# Patient Record
Sex: Male | Born: 1990 | Race: Black or African American | Hispanic: No | Marital: Single | Smoking: Never smoker
Health system: Southern US, Community
[De-identification: ages and names within clinical notes are randomized; demographics above are authoritative.]

## PROBLEM LIST (undated history)

## (undated) DIAGNOSIS — E119 Type 2 diabetes mellitus without complications: Secondary | ICD-10-CM

## (undated) HISTORY — PX: TONSILLECTOMY: SUR1361

---

## 2014-05-22 ENCOUNTER — Encounter (HOSPITAL_BASED_OUTPATIENT_CLINIC_OR_DEPARTMENT_OTHER): Payer: Self-pay | Admitting: *Deleted

## 2014-05-22 ENCOUNTER — Emergency Department (HOSPITAL_BASED_OUTPATIENT_CLINIC_OR_DEPARTMENT_OTHER)
Admission: EM | Admit: 2014-05-22 | Discharge: 2014-05-23 | Disposition: A | Discharge status: Home / Self Care | Payer: Managed Care, Other (non HMO) | Attending: Emergency Medicine | Admitting: Emergency Medicine

## 2014-05-22 DIAGNOSIS — L02215 Cutaneous abscess of perineum: Secondary | ICD-10-CM | POA: Insufficient documentation

## 2014-05-22 DIAGNOSIS — E119 Type 2 diabetes mellitus without complications: Secondary | ICD-10-CM | POA: Diagnosis not present

## 2014-05-22 DIAGNOSIS — Z794 Long term (current) use of insulin: Secondary | ICD-10-CM | POA: Insufficient documentation

## 2014-05-22 HISTORY — DX: Type 2 diabetes mellitus without complications: E11.9

## 2014-05-22 LAB — CBG MONITORING, ED: Glucose-Capillary: 284 mg/dL — ABNORMAL HIGH (ref 70–99)

## 2014-05-22 NOTE — ED Notes (Signed)
Painful lump to perineum since friday

## 2014-05-23 ENCOUNTER — Emergency Department (HOSPITAL_BASED_OUTPATIENT_CLINIC_OR_DEPARTMENT_OTHER): Payer: Managed Care, Other (non HMO)

## 2014-05-23 LAB — CBC WITH DIFFERENTIAL/PLATELET
BASOS PCT: 0 % (ref 0–1)
Basophils Absolute: 0 10*3/uL (ref 0.0–0.1)
Eosinophils Absolute: 0 10*3/uL (ref 0.0–0.7)
Eosinophils Relative: 0 % (ref 0–5)
HCT: 40.2 % (ref 39.0–52.0)
Hemoglobin: 14 g/dL (ref 13.0–17.0)
LYMPHS ABS: 0.9 10*3/uL (ref 0.7–4.0)
Lymphocytes Relative: 11 % — ABNORMAL LOW (ref 12–46)
MCH: 33.6 pg (ref 26.0–34.0)
MCHC: 34.8 g/dL (ref 30.0–36.0)
MCV: 96.4 fL (ref 78.0–100.0)
MONOS PCT: 11 % (ref 3–12)
Monocytes Absolute: 0.9 10*3/uL (ref 0.1–1.0)
NEUTROS ABS: 6.5 10*3/uL (ref 1.7–7.7)
Neutrophils Relative %: 78 % — ABNORMAL HIGH (ref 43–77)
PLATELETS: 199 10*3/uL (ref 150–400)
RBC: 4.17 MIL/uL — ABNORMAL LOW (ref 4.22–5.81)
RDW: 10.3 % — ABNORMAL LOW (ref 11.5–15.5)
WBC: 8.3 10*3/uL (ref 4.0–10.5)

## 2014-05-23 LAB — BASIC METABOLIC PANEL
Anion gap: 4 — ABNORMAL LOW (ref 5–15)
BUN: 10 mg/dL (ref 6–23)
CALCIUM: 8.6 mg/dL (ref 8.4–10.5)
CO2: 24 mmol/L (ref 19–32)
Chloride: 104 mmol/L (ref 96–112)
Creatinine, Ser: 0.76 mg/dL (ref 0.50–1.35)
GFR calc Af Amer: >90 mL/min (ref >90)
GLUCOSE: 316 mg/dL — AB (ref 70–99)
Potassium: 3.6 mmol/L (ref 3.5–5.1)
Sodium: 132 mmol/L — ABNORMAL LOW (ref 135–145)

## 2014-05-23 LAB — URINALYSIS, ROUTINE W REFLEX MICROSCOPIC
Bilirubin Urine: NEGATIVE
HGB URINE DIPSTICK: NEGATIVE
Ketones, ur: 15 mg/dL — AB
Leukocytes, UA: NEGATIVE
Nitrite: NEGATIVE
Protein, ur: NEGATIVE mg/dL
Urobilinogen, UA: 1 mg/dL (ref 0.0–1.0)
pH: 7.5 (ref 5.0–8.0)

## 2014-05-23 LAB — URINE MICROSCOPIC-ADD ON

## 2014-05-23 MED ORDER — OXYCODONE-ACETAMINOPHEN 5-325 MG PO TABS: 1.0000 | ORAL_TABLET | Freq: Four times a day (QID) | ORAL | Status: AC | PRN

## 2014-05-23 MED ORDER — DOXYCYCLINE HYCLATE 100 MG PO CAPS
100.0000 mg | ORAL_CAPSULE | Freq: Two times a day (BID) | ORAL | Status: AC
Start: 2014-05-23 — End: ?

## 2014-05-23 MED ORDER — IOHEXOL 300 MG/ML  SOLN
100.0000 mL | Freq: Once | INTRAMUSCULAR | Status: AC | PRN
  Administered 2014-05-23: 100 mL via INTRAVENOUS

## 2014-05-23 MED ORDER — CEPHALEXIN 500 MG PO CAPS: 500.0000 mg | ORAL_CAPSULE | Freq: Four times a day (QID) | ORAL | Status: AC

## 2014-05-23 MED ORDER — LIDOCAINE HCL 2 % IJ SOLN
INTRAMUSCULAR | Status: AC
Start: 2014-05-23 — End: 2014-05-23
  Administered 2014-05-23: 400 mg
  Filled 2014-05-23: qty 20

## 2014-05-23 MED ORDER — HYDROMORPHONE HCL 1 MG/ML IJ SOLN
1.0000 mg | Freq: Once | INTRAMUSCULAR | Status: AC
  Administered 2014-05-23: 1 mg via INTRAVENOUS
  Filled 2014-05-23: qty 1

## 2014-05-23 MED ORDER — LIDOCAINE HCL (PF) 1 % IJ SOLN: 30.0000 mL | Freq: Once | INTRAMUSCULAR | Status: DC

## 2014-05-23 NOTE — ED Notes (Signed)
PA at bedside preparing for abscess I&D.

## 2014-05-23 NOTE — Discharge Instructions (Signed)
Cellulitis °Cellulitis is an infection of the skin and the tissue beneath it. The infected area is usually red and tender. Cellulitis occurs most often in the arms and lower legs.  °CAUSES  °Cellulitis is caused by bacteria that enter the skin through cracks or cuts in the skin. The most common types of bacteria that cause cellulitis are staphylococci and streptococci. °SIGNS AND SYMPTOMS  °· Redness and warmth. °· Swelling. °· Tenderness or pain. °· Fever. °DIAGNOSIS  °Your health care provider can usually determine what is wrong based on a physical exam. Blood tests may also be done. °TREATMENT  °Treatment usually involves taking an antibiotic medicine. °HOME CARE INSTRUCTIONS  °· Take your antibiotic medicine as directed by your health care provider. Finish the antibiotic even if you start to feel better. °· Keep the infected arm or leg elevated to reduce swelling. °· Apply a warm cloth to the affected area up to 4 times per day to relieve pain. °· Take medicines only as directed by your health care provider. °· Keep all follow-up visits as directed by your health care provider. °SEEK MEDICAL CARE IF:  °· You notice red streaks coming from the infected area. °· Your red area gets larger or turns dark in color. °· Your bone or joint underneath the infected area becomes painful after the skin has healed. °· Your infection returns in the same area or another area. °· You notice a swollen bump in the infected area. °· You develop new symptoms. °· You have a fever. °SEEK IMMEDIATE MEDICAL CARE IF:  °· You feel very sleepy. °· You develop vomiting or diarrhea. °· You have a general ill feeling (malaise) with muscle aches and pains. °MAKE SURE YOU:  °· Understand these instructions. °· Will watch your condition. °· Will get help right away if you are not doing well or get worse. °Document Released: 12/11/2004 Document Revised: 07/18/2013 Document Reviewed: 05/19/2011 °[employer]® Patient Information ©2015 [employer].  This information is not intended to replace advice given to you by your health care provider. Make sure you discuss any questions you have with your health care provider. ° °Abscess °An abscess is an infected area that contains a collection of pus and debris. It can occur in almost any part of the body. An abscess is also known as a furuncle or boil. °CAUSES  °An abscess occurs when tissue gets infected. This can occur from blockage of oil or sweat glands, infection of hair follicles, or a minor injury to the skin. As the body tries to fight the infection, pus collects in the area and creates pressure under the skin. This pressure causes pain. People with weakened immune systems have difficulty fighting infections and get certain abscesses more often.  °SYMPTOMS °Usually an abscess develops on the skin and becomes a painful mass that is red, warm, and tender. If the abscess forms under the skin, you may feel a moveable soft area under the skin. Some abscesses break open (rupture) on their own, but most will continue to get worse without care. The infection can spread deeper into the body and eventually into the bloodstream, causing you to feel ill.  °DIAGNOSIS  °Your caregiver will take your medical history and perform a physical exam. A sample of fluid may also be taken from the abscess to determine what is causing your infection. °TREATMENT  °Your caregiver may prescribe antibiotic medicines to fight the infection. However, taking antibiotics alone usually does not cure an abscess. Your caregiver may need to   make a small cut (incision) in the abscess to drain the pus. In some cases, gauze is packed into the abscess to reduce pain and to continue draining the area. °HOME CARE INSTRUCTIONS  °· Only take over-the-counter or prescription medicines for pain, discomfort, or fever as directed by your caregiver. °· If you were prescribed antibiotics, take them as directed. Finish them even if you start to feel  better. °· If gauze is used, follow your caregiver's directions for changing the gauze. °· To avoid spreading the infection: °¨ Keep your draining abscess covered with a bandage. °¨ Wash your hands well. °¨ Do not share personal care items, towels, or whirlpools with others. °¨ Avoid skin contact with others. °· Keep your skin and clothes clean around the abscess. °· Keep all follow-up appointments as directed by your caregiver. °SEEK MEDICAL CARE IF:  °· You have increased pain, swelling, redness, fluid drainage, or bleeding. °· You have muscle aches, chills, or a general ill feeling. °· You have a fever. °MAKE SURE YOU:  °· Understand these instructions. °· Will watch your condition. °· Will get help right away if you are not doing well or get worse. °Document Released: 12/11/2004 Document Revised: 09/02/2011 Document Reviewed: 05/16/2011 °[employer]® Patient Information ©2015 [employer]. This information is not intended to replace advice given to you by your health care provider. Make sure you discuss any questions you have with your health care provider. ° °

## 2014-05-23 NOTE — ED Provider Notes (Signed)
CSN: [id number]     Arrival date & time 05/22/14  2005 History   First MD Initiated Contact with Patient 05/23/14 0001     Chief Complaint  Patient presents with  . Abscess     (Consider location/radiation/quality/duration/timing/severity/associated sxs/prior Treatment) HPI Pt is a 23 yom with pmhx of DM who presents to the ER c/o abscess to perineum.  Pt states his s/s began gradually 4 days ago, and have since worsened.  Pt reports pain and swelling to his perineum 10/10 pain.  Pt denies associated N/V/D, abd pain, fever, numbness, dysuria, rectal pain, constipation.    Past Medical History  Diagnosis Date  . Diabetes mellitus without complication    Past Surgical History  Procedure Laterality Date  . Tonsillectomy     No family history on file. History  Substance Use Topics  . Smoking status: Never Smoker   . Smokeless tobacco: Never Used  . Alcohol Use: No    Review of Systems  Constitutional: Negative for fever and chills.  Skin: Positive for rash.      Allergies  Review of patient's allergies indicates no known allergies.  Home Medications   Prior to Admission medications   Medication Sig Start Date End Date Taking? Authorizing Provider  insulin aspart (NOVOLOG) 100 UNIT/ML injection Inject into the skin 3 (three) times daily before meals. SS   Yes Historical Provider, MD  insulin glargine (LANTUS) 100 UNIT/ML injection Inject 24 Units into the skin at bedtime.   Yes Historical Provider, MD  cephALEXin (KEFLEX) 500 MG capsule Take 1 capsule (500 mg total) by mouth 4 (four) times daily. 05/23/14   Ladona Mow, PA-C  doxycycline (VIBRAMYCIN) 100 MG capsule Take 1 capsule (100 mg total) by mouth 2 (two) times daily. One po bid x 7 days 05/23/14   Ladona Mow, PA-C  oxyCODONE-acetaminophen (PERCOCET) 5-325 MG per tablet Take 1-2 tablets by mouth every 6 (six) hours as needed. 05/23/14   Ladona Mow, PA-C   BP 133/83 mmHg  Pulse 105  Temp(Src) 100 F (37.8 C) (Oral)  Resp 16   Ht  (1.702 m)  Wt 145 lb (65.772 kg)  BMI 22.71 kg/m2  SpO2 98% Physical Exam  Constitutional: He is oriented to person, place, and time. He appears well-developed and well-nourished. No distress.  HENT:  Head: Normocephalic and atraumatic.  Eyes: Right eye exhibits no discharge. Left eye exhibits no discharge. No scleral icterus.  Neck: Normal range of motion.  Pulmonary/Chest: Effort normal. No respiratory distress.  Abdominal: Hernia confirmed negative in the right inguinal area and confirmed negative in the left inguinal area.  Genitourinary: Penis normal. Cremasteric reflex is present. Right testis shows no mass, no swelling and no tenderness. Right testis is descended. Cremasteric reflex is not absent on the right side. Left testis shows no mass, no swelling and no tenderness. Left testis is descended. Cremasteric reflex is not absent on the left side. Circumcised. No penile tenderness. No discharge found.     4-5x3 cm raised area of induration noted extending from posterior scrotum to rectum.  No scrotal swelling, testicular pain.  No rectal involvement.    Musculoskeletal: Normal range of motion.  Lymphadenopathy:       Right: No inguinal adenopathy present.       Left: No inguinal adenopathy present.  Neurological: He is alert and oriented to person, place, and time.  Skin: Skin is warm and dry. He is not diaphoretic.  Psychiatric: He has a normal mood and affect.  Nursing note and vitals reviewed.   ED Course  INCISION AND DRAINAGE Date/Time: 05/23/2014 2:00 PM Performed by: Ladona MowMINTZ, Misako Roeder Authorized by: Ladona MowMINTZ, Taylan Mayhan Consent: Verbal consent obtained. Consent given by: patient Type: abscess Body area: anogenital Location details: perineum Anesthesia: local infiltration Local anesthetic: lidocaine 2% without epinephrine Anesthetic total: 4 ml Patient sedated: no Scalpel size: 11 Incision type: single straight Complexity: simple Drainage: bloody Drainage amount:  scant Wound treatment: wound left open Patient tolerance: Patient tolerated the procedure well with no immediate complications   (including critical care time) Labs Review Labs Reviewed  CBC WITH DIFFERENTIAL/PLATELET - Abnormal; Notable for the following:    RBC 4.17 (*)    RDW 10.3 (*)    Neutrophils Relative % 78 (*)    Lymphocytes Relative 11 (*)    All other components within normal limits  BASIC METABOLIC PANEL - Abnormal; Notable for the following:    Sodium 132 (*)    Glucose, Bld 316 (*)    Anion gap 4 (*)    All other components within normal limits  URINALYSIS, ROUTINE W REFLEX MICROSCOPIC - Abnormal; Notable for the following:    Specific Gravity, Urine >1.046 (*)    Glucose, UA >1000 (*)    Ketones, ur 15 (*)    All other components within normal limits  CBG MONITORING, ED - Abnormal; Notable for the following:    Glucose-Capillary 284 (*)    All other components within normal limits  URINE MICROSCOPIC-ADD ON    Imaging Review Ct Abdomen Pelvis W Contrast  05/23/2014   CLINICAL DATA:  Painful perineum lump for 4 days. History of diabetes.  EXAM: CT ABDOMEN AND PELVIS WITH CONTRAST  TECHNIQUE: Multidetector CT imaging of the abdomen and pelvis was performed using the standard protocol following bolus administration of intravenous contrast.  CONTRAST:  100mL OMNIPAQUE IOHEXOL 300 MG/ML  SOLN  COMPARISON:  None.  FINDINGS: LUNG BASES: Included view of the lung bases are clear. Visualized heart and pericardium are unremarkable.  SOLID ORGANS: The liver, spleen, gallbladder, pancreas and adrenal glands are unremarkable.  GASTROINTESTINAL TRACT: The stomach, small and large bowel are normal in course and caliber without inflammatory changes. Moderate amount of retained large bowel stool. Normal air-filled appendix.  KIDNEYS/ URINARY TRACT: Kidneys are orthotopic, demonstrating symmetric enhancement. No nephrolithiasis, hydronephrosis or solid renal masses. The unopacified ureters  are normal in course and caliber. Urinary bladder is partially distended and unremarkable.  PERITONEUM/RETROPERITONEUM: Aortoiliac vessels are normal in course and caliber. No lymphadenopathy by CT size criteria. Prostate appears enlarged and edematous. Seminal vesicle calcifications can be seen with diabetes. No intraperitoneal free fluid nor free air.  SOFT TISSUE/OSSEOUS STRUCTURES: Partially imaged LEFT hydrocele. Inflammation and superimposed superficial 17 mm ovoid fluid collection within the junction of the scrotum and perineum towards the LEFT, axial 90/93. No peroneal subcutaneous gas or radiopaque foreign bodies.  IMPRESSION: Perineal inflammation suggests cellulitis with superimposed 17 mm superficial abscess at junction of scrotum and perineum toward the LEFT. No subcutaneous gas to suggest Fournier's gangrene.  Enlarged edematous prostate concerning for prostatitis.   Electronically Signed   By: Awilda Metroourtnay  Bloomer   On: 05/23/2014 01:40     EKG Interpretation None      MDM   Final diagnoses:  Perineal abscess    4 days of perineal abscess. Abscess extends throughout perineum-- will f/u with CT for r/o of fournier's vs tracking abscess.    CT with impression: Perineal inflammation suggests cellulitis with superimposed 17 mm superficial  abscess at junction of scrotum and perineum toward the LEFT. No subcutaneous gas to suggest Fournier's gangrene.  Enlarged edematous prostate concerning for prostatitis.  Based on these results, abscess appears superficial and amenable to I&D in the ER.  Abscess was not large enough to warrant packing or drain,  wound recheck in 2 days. Encouraged home warm soaks and flushing.  Mild signs of cellulitis is surrounding skin.  Will d/c to home.  Abx given based on hx of DM and location/induration of abscess.  No concern for prostatitis on history and exam, although I conveyed to pt the fact that this was noted on the CT and strongly encouraged him to f/u  with his PCP regarding these findings.  I discussed return precautions with pt and encouraged pt to call or return to the ER should he have any questions or concerns.    BP 133/83 mmHg  Pulse 105  Temp(Src) 100 F (37.8 C) (Oral)  Resp 16  Ht  (1.702 m)  Wt 145 lb (65.772 kg)  BMI 22.71 kg/m2  SpO2 98%  Signed,  Ladona Mow, PA-C 2:01 PM  Patient seen and discussed with Dr. Brock Bad, MD  Ladona Mow, PA-C 05/23/14 1401  Paula Libra, MD 05/23/14 2257

## 2014-05-23 NOTE — ED Notes (Signed)
PA at bedside.

## 2016-04-05 IMAGING — CT CT ABD-PELV W/ CM
2 of 4 series · 16 of 46 positions shown, 18 images · IV contrast (APPLIED)
Comparison: None.

CLINICAL DATA: Painful perineum lump for 4 days. History of
diabetes.

EXAM:
CT ABDOMEN AND PELVIS WITH CONTRAST
TECHNIQUE: Multidetector CT imaging of the abdomen and pelvis was performed
using the standard protocol following bolus administration of
intravenous contrast.
CONTRAST:  100mL OMNIPAQUE IOHEXOL 300 MG/ML  SOLN

[Series 2: abd/pelvis 5.0 b31f · axial · 0.70mm/px · z∈[-309,+116]mm · 13 of 93 slices shown, 15 images]
[im 4/93  soft-tissue]
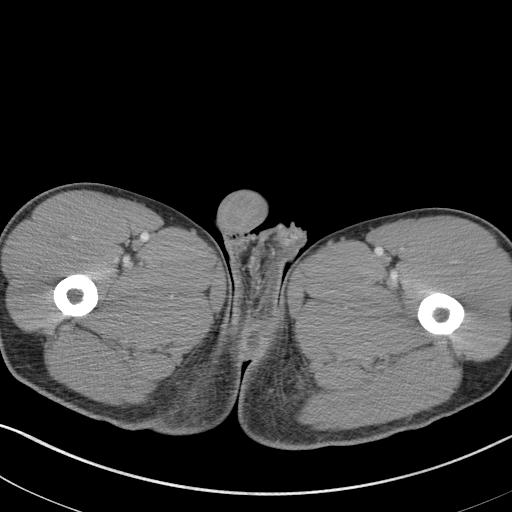
[im 4/93  bone]
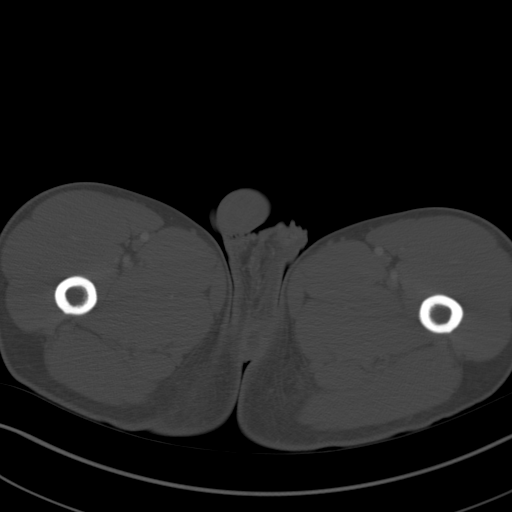
[im 12/93  soft-tissue]
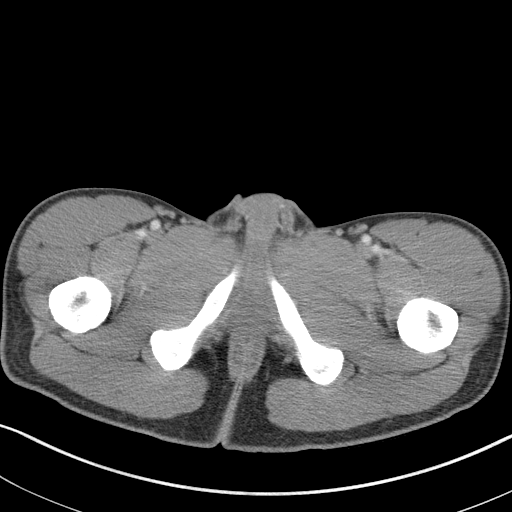
[im 20/93  soft-tissue]
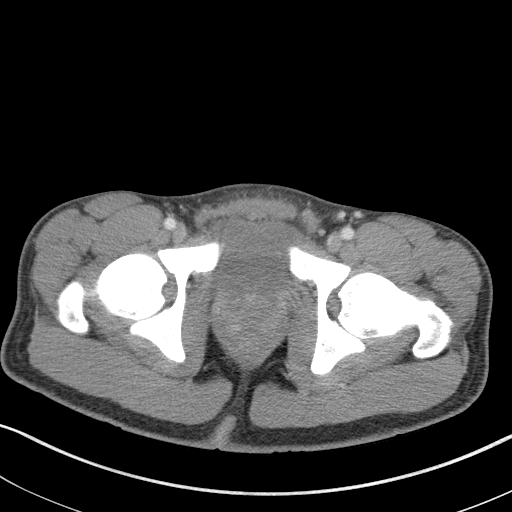
[im 27/93  soft-tissue]
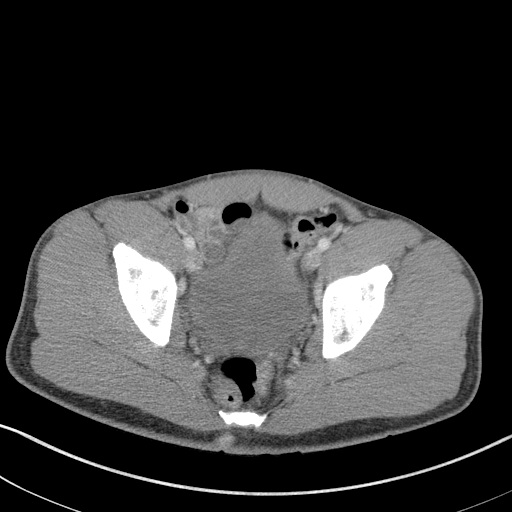
[im 31/93  soft-tissue]
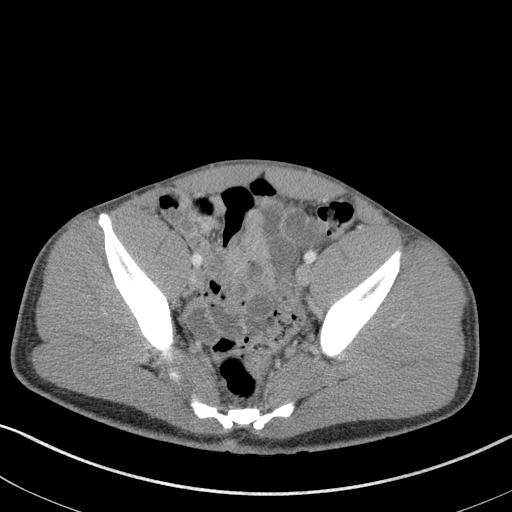
[im 39/93  soft-tissue]
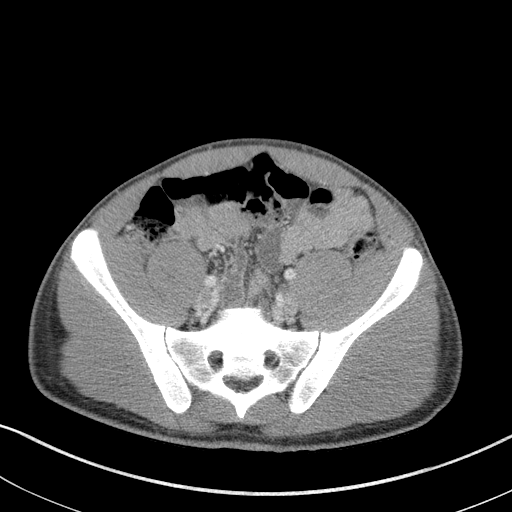
[im 47/93  soft-tissue]
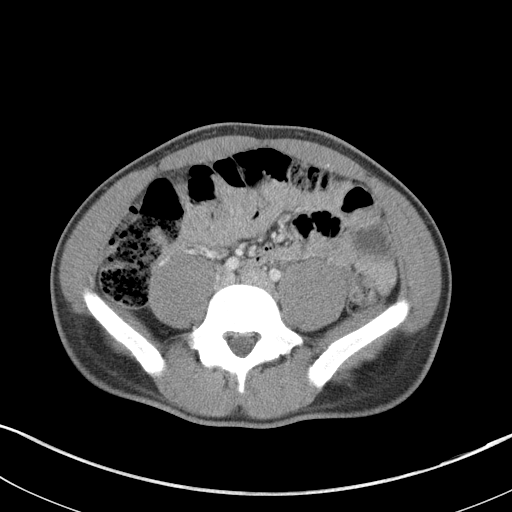
[im 54/93  soft-tissue]
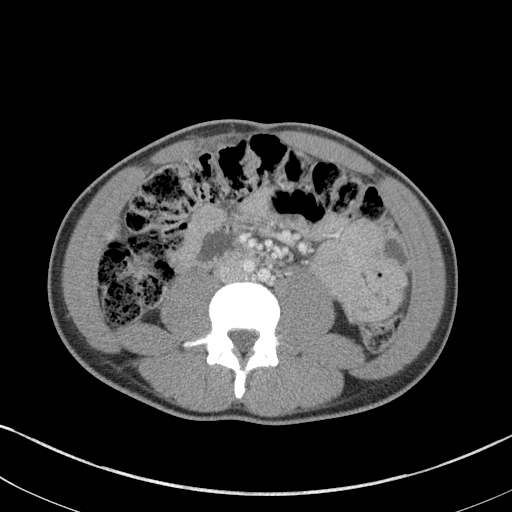
[im 62/93  soft-tissue]
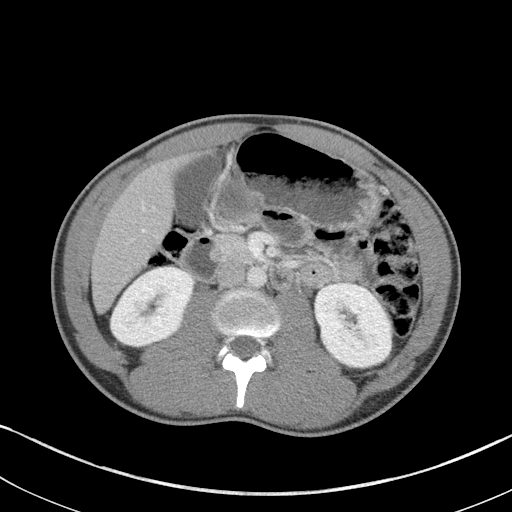
[im 62/93  bone]
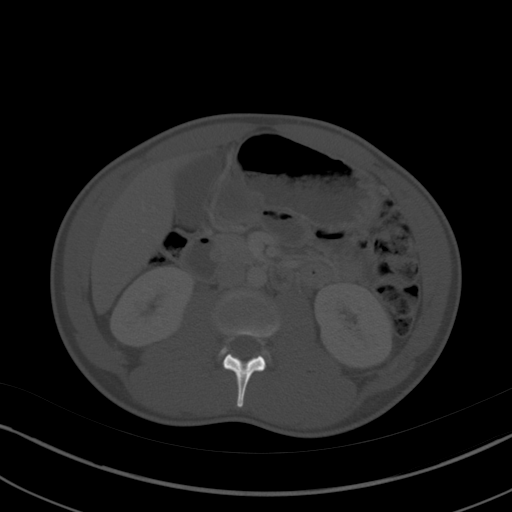
[im 66/93  soft-tissue]
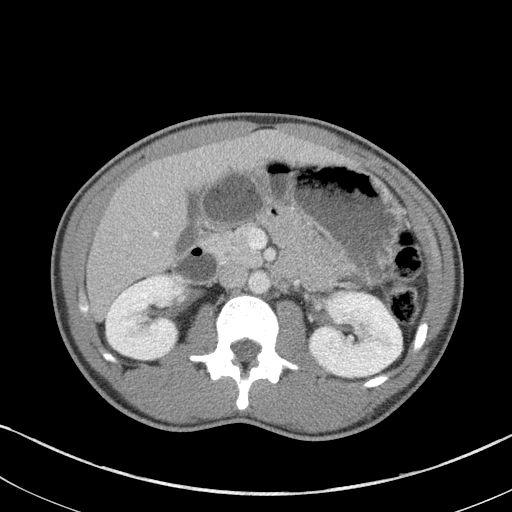
[im 73/93  soft-tissue]
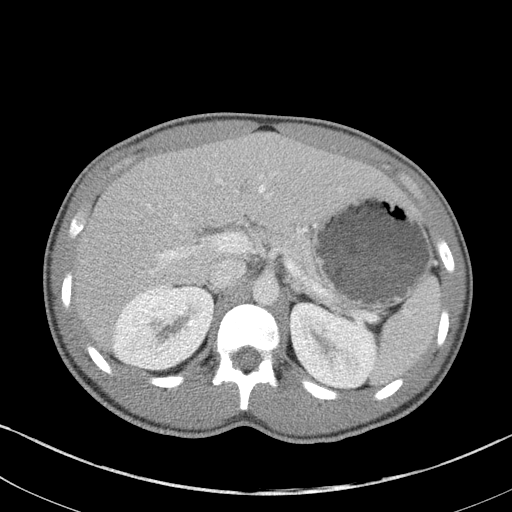
[im 81/93  soft-tissue]
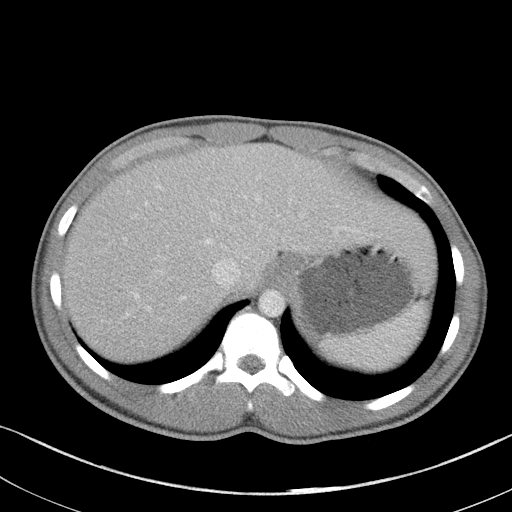
[im 89/93  soft-tissue]
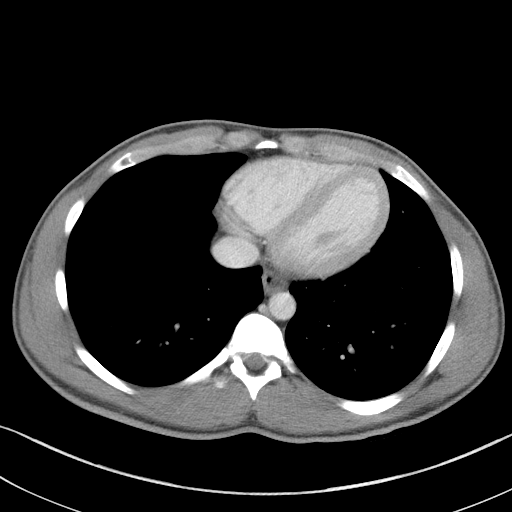

[Series 5: abd/pelvis 3.0 coronal · coronal · 0.74mm/px · 3 of 71 slices shown]
[im 24/71  soft-tissue]
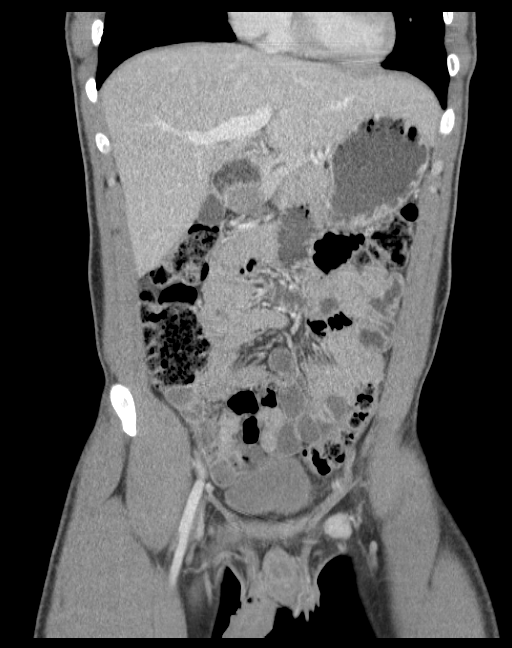
[im 32/71  soft-tissue]
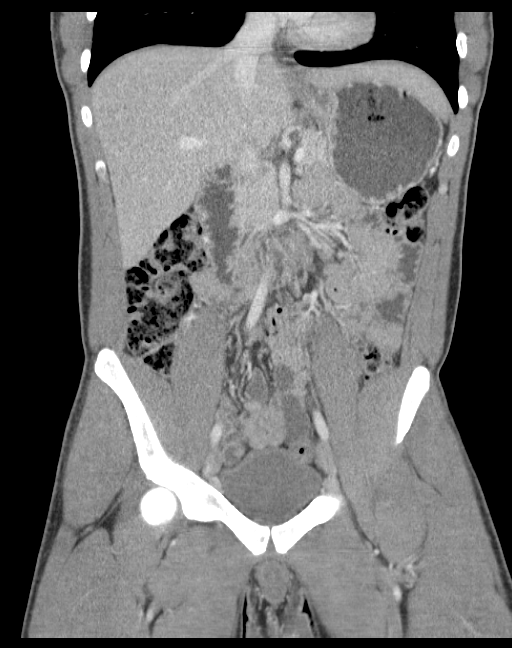
[im 39/71  soft-tissue]
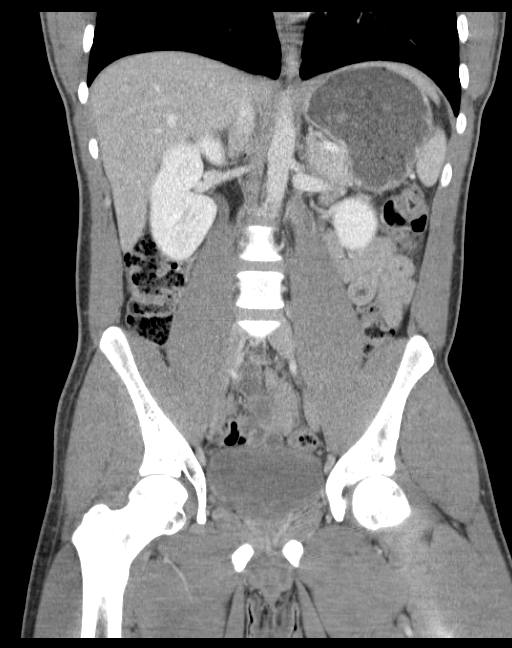

[16 of 46 positions shown; findings below may reference images not displayed]

FINDINGS: LUNG BASES: Included view of the lung bases are clear. Visualized
heart and pericardium are unremarkable.

SOLID ORGANS: The liver, spleen, gallbladder, pancreas and adrenal
glands are unremarkable.

GASTROINTESTINAL TRACT: The stomach, small and large bowel are
normal in course and caliber without inflammatory changes. Moderate
amount of retained large bowel stool. Normal air-filled appendix.

KIDNEYS/ URINARY TRACT: Kidneys are orthotopic, demonstrating
symmetric enhancement. No nephrolithiasis, hydronephrosis or solid
renal masses. The unopacified ureters are normal in course and
caliber. Urinary bladder is partially distended and unremarkable.

PERITONEUM/RETROPERITONEUM: Aortoiliac vessels are normal in course
and caliber. No lymphadenopathy by CT size criteria. Prostate
appears enlarged and edematous. Seminal vesicle calcifications can
be seen with diabetes. No intraperitoneal free fluid nor free air.

SOFT TISSUE/OSSEOUS STRUCTURES: Partially imaged LEFT hydrocele.
Inflammation and superimposed superficial 17 mm ovoid fluid
collection within the junction of the scrotum and perineum towards
the LEFT, axial 90/93. No peroneal subcutaneous gas or radiopaque
foreign bodies.
IMPRESSION: Perineal inflammation suggests cellulitis with superimposed 17 mm
superficial abscess at junction of scrotum and perineum toward the
LEFT. No subcutaneous gas to suggest Massimo gangrene.

Enlarged edematous prostate concerning for prostatitis.

  By: Moriah Smitty
